# Patient Record
Sex: Male | Born: 1990 | Race: White | Hispanic: No | Marital: Single
Health system: Southern US, Community
[De-identification: ages and names within clinical notes are randomized; demographics above are authoritative.]

---

## 2009-01-07 ENCOUNTER — Emergency Department (HOSPITAL_BASED_OUTPATIENT_CLINIC_OR_DEPARTMENT_OTHER): Admission: EM | Admit: 2009-01-07 | Discharge: 2009-01-07 | Payer: Self-pay | Admitting: Emergency Medicine

## 2009-01-07 ENCOUNTER — Ambulatory Visit: Payer: Self-pay | Admitting: Diagnostic Radiology

## 2009-06-23 ENCOUNTER — Emergency Department (HOSPITAL_COMMUNITY): Admission: EM | Admit: 2009-06-23 | Discharge: 2009-06-23 | Payer: Self-pay | Admitting: Emergency Medicine

## 2010-07-06 LAB — DIFFERENTIAL
Neutro Abs: 8.6 10*3/uL — ABNORMAL HIGH (ref 1.7–7.7)
Neutrophils Relative %: 80 % — ABNORMAL HIGH (ref 43–77)

## 2010-07-06 LAB — CBC
HCT: 45.9 % (ref 39.0–52.0)
MCV: 91.4 fL (ref 78.0–100.0)
Platelets: 190 10*3/uL (ref 150–400)

## 2010-07-06 LAB — POCT I-STAT, CHEM 8
BUN: 14 mg/dL (ref 6–23)
Calcium, Ion: 1.1 mmol/L — ABNORMAL LOW (ref 1.12–1.32)
Chloride: 104 meq/L (ref 96–112)
Creatinine, Ser: 0.8 mg/dL (ref 0.4–1.5)
Glucose, Bld: 125 mg/dL — ABNORMAL HIGH (ref 70–99)
HCT: 46 % (ref 39.0–52.0)
Hemoglobin: 15.6 g/dL (ref 13.0–17.0)
Potassium: 3.3 meq/L — ABNORMAL LOW (ref 3.5–5.1)
Sodium: 138 meq/L (ref 135–145)
TCO2: 26 mmol/L (ref 0–100)

## 2010-07-17 LAB — CBC
HCT: 45.8 % (ref 39.0–52.0)
Hemoglobin: 15.9 g/dL (ref 13.0–17.0)
MCHC: 34.7 g/dL (ref 30.0–36.0)
Platelets: 195 10*3/uL (ref 150–400)
RBC: 5.13 MIL/uL (ref 4.22–5.81)
RDW: 12.4 % (ref 11.5–15.5)
WBC: 10.3 10*3/uL (ref 4.0–10.5)

## 2010-07-17 LAB — POCT TOXICOLOGY PANEL

## 2010-07-17 LAB — DIFFERENTIAL
Basophils Absolute: 0 10*3/uL (ref 0.0–0.1)
Eosinophils Absolute: 0 10*3/uL (ref 0.0–0.7)

## 2010-07-17 LAB — BASIC METABOLIC PANEL
BUN: 10 mg/dL (ref 6–23)
GFR calc non Af Amer: >60 mL/min (ref >60)
Glucose, Bld: 112 mg/dL — ABNORMAL HIGH (ref 70–99)
Potassium: 4.1 mEq/L (ref 3.5–5.1)

## 2010-09-13 ENCOUNTER — Emergency Department (HOSPITAL_BASED_OUTPATIENT_CLINIC_OR_DEPARTMENT_OTHER)
Admission: EM | Admit: 2010-09-13 | Discharge: 2010-09-13 | Disposition: A | Discharge status: Home / Self Care | Payer: Worker's Compensation | Attending: Emergency Medicine | Admitting: Emergency Medicine

## 2010-09-13 ENCOUNTER — Emergency Department (INDEPENDENT_AMBULATORY_CARE_PROVIDER_SITE_OTHER): Payer: Worker's Compensation

## 2010-09-13 DIAGNOSIS — W540XXA Bitten by dog, initial encounter: Secondary | ICD-10-CM | POA: Insufficient documentation

## 2010-09-13 DIAGNOSIS — S61209A Unspecified open wound of unspecified finger without damage to nail, initial encounter: Secondary | ICD-10-CM

## 2010-09-13 DIAGNOSIS — Y92009 Unspecified place in unspecified non-institutional (private) residence as the place of occurrence of the external cause: Secondary | ICD-10-CM | POA: Insufficient documentation

## 2010-09-13 DIAGNOSIS — M79609 Pain in unspecified limb: Secondary | ICD-10-CM

## 2010-09-13 DIAGNOSIS — R609 Edema, unspecified: Secondary | ICD-10-CM

## 2010-11-05 IMAGING — CT CT HEAD W/O CM
1 series · 16 of 30 positions shown, 20 images · non-contrast
Comparison: None available.

CLINICAL DATA: Dizziness and lightheadedness.

CT HEAD WITHOUT CONTRAST
TECHNIQUE: Contiguous axial images were obtained from the base of
the skull through the vertex without contrast.

[Series 2: head 4.8 h37s · axial · 0.47mm/px · z∈[-77,+56]mm · 16 of 32 slices shown, 20 images]
[im 2/32  brain]
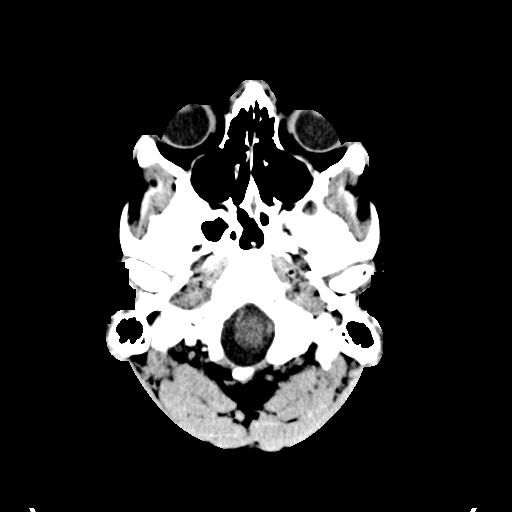
[im 2/32  bone]
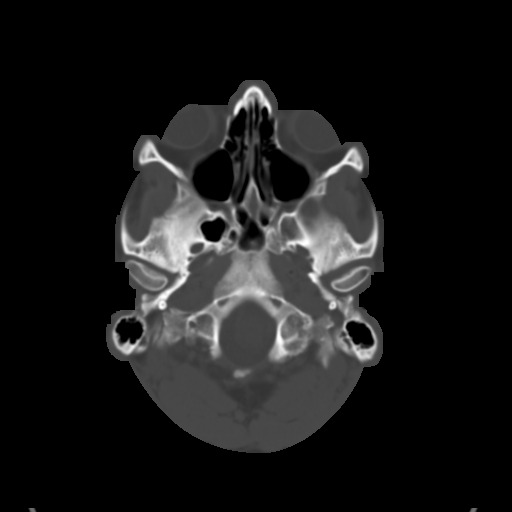
[im 4/32  brain]
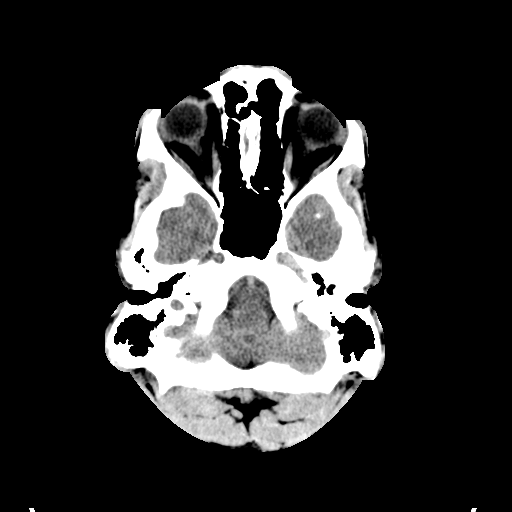
[im 6/32  brain]
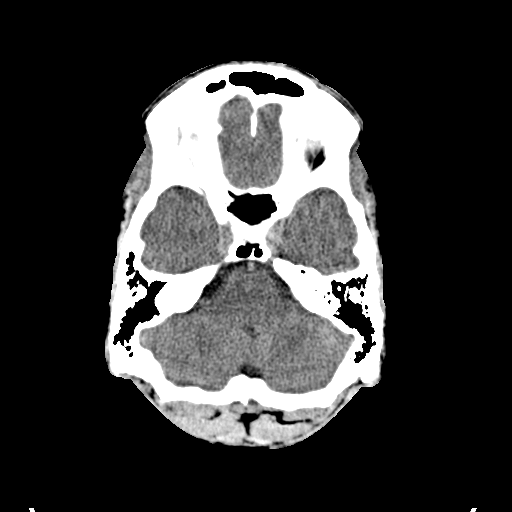
[im 8/32  brain]
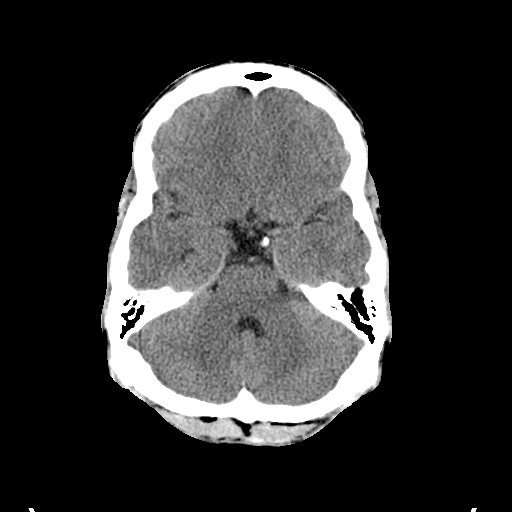
[im 9/32  brain]
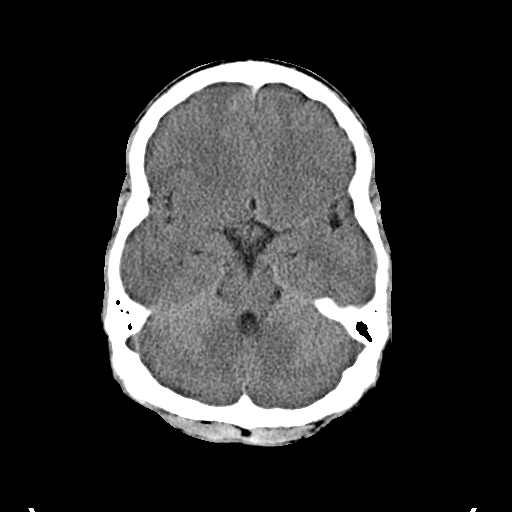
[im 9/32  bone]
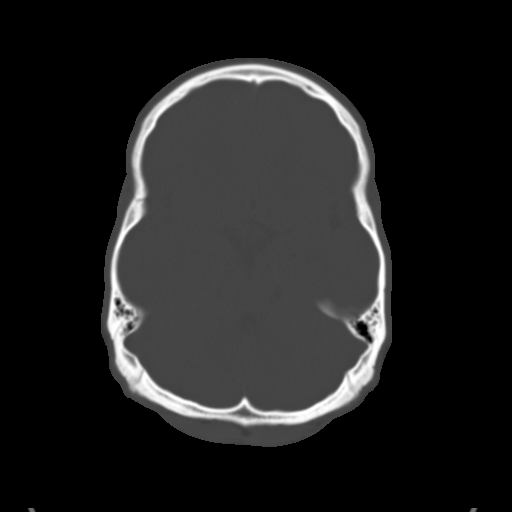
[im 11/32  brain]
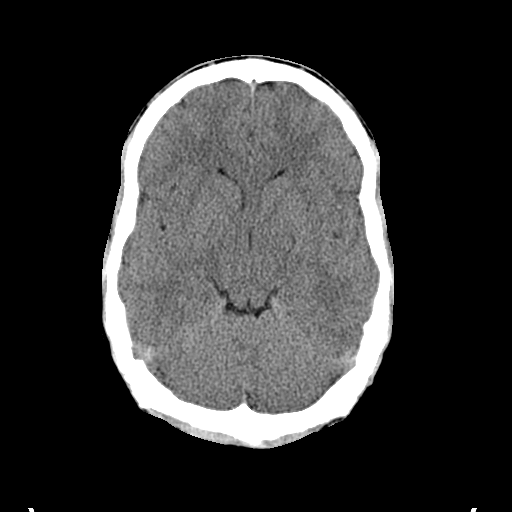
[im 13/32  brain]
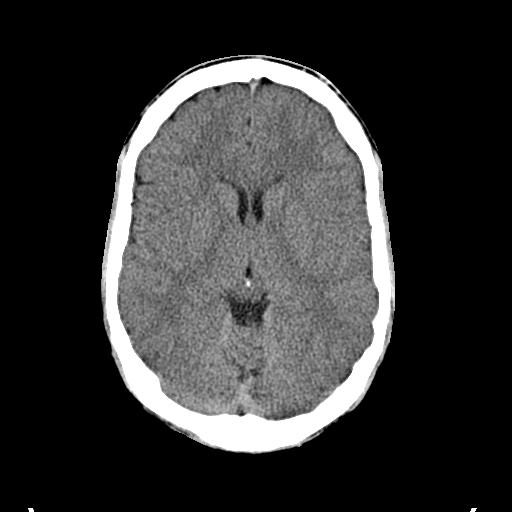
[im 15/32  brain]
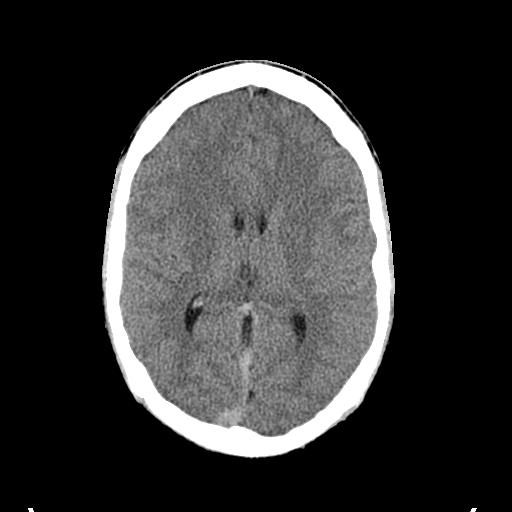
[im 17/32  brain]
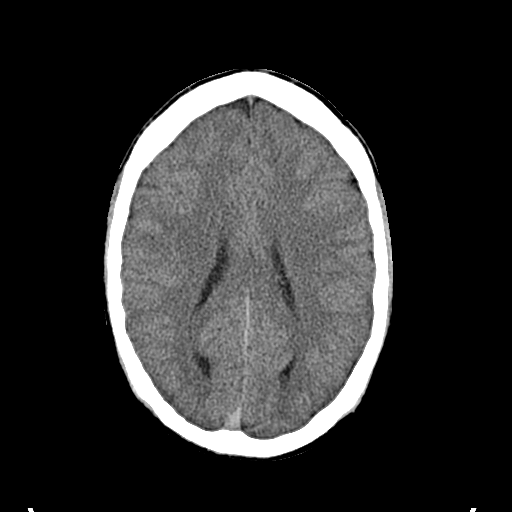
[im 17/32  bone]
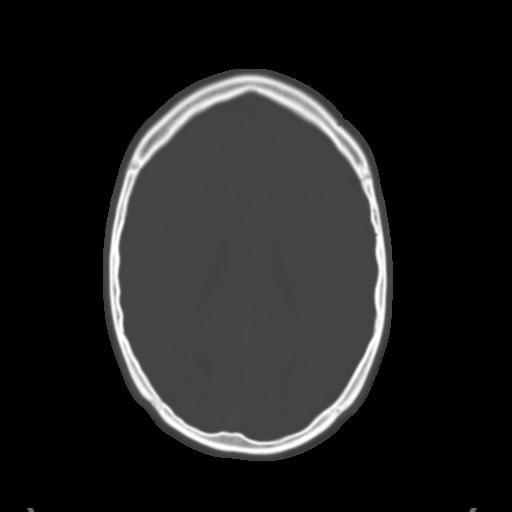
[im 19/32  brain]
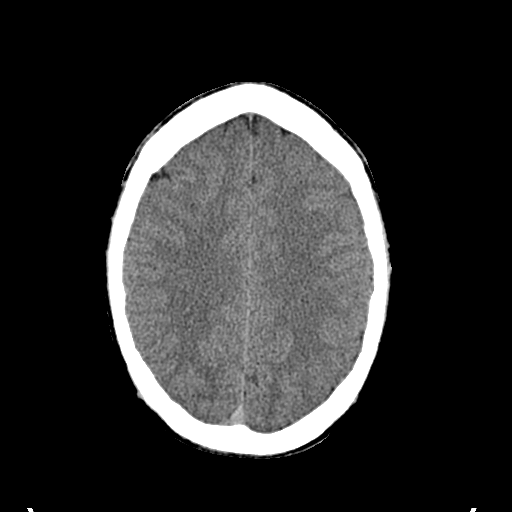
[im 21/32  brain]
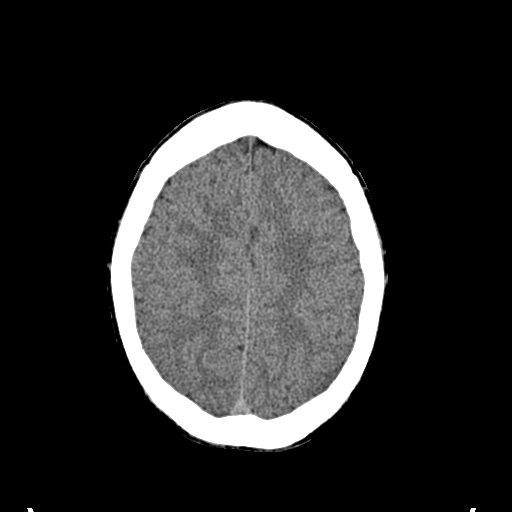
[im 23/32  brain]
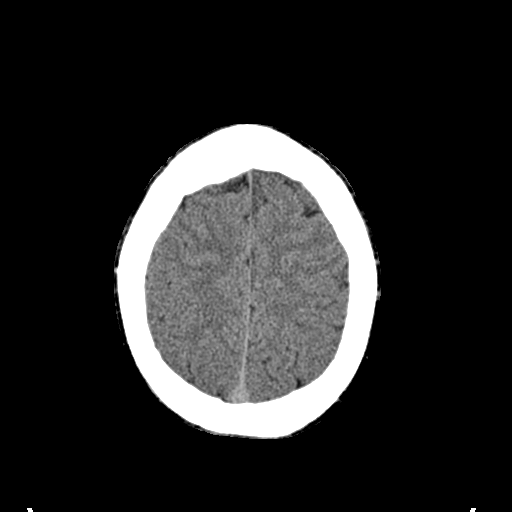
[im 24/32  brain]
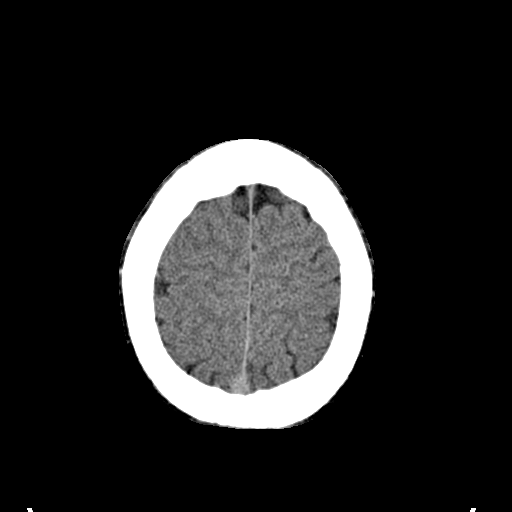
[im 24/32  bone]
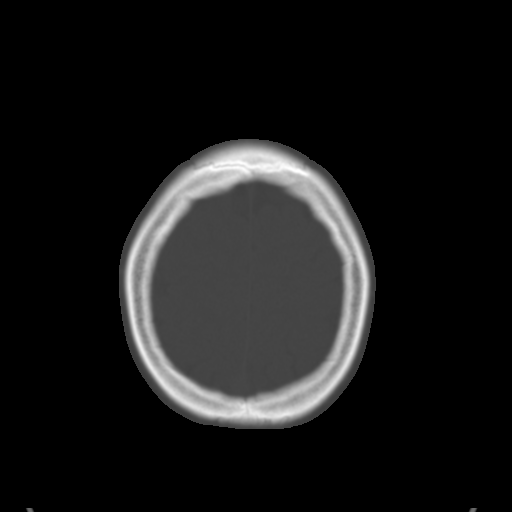
[im 26/32  brain]
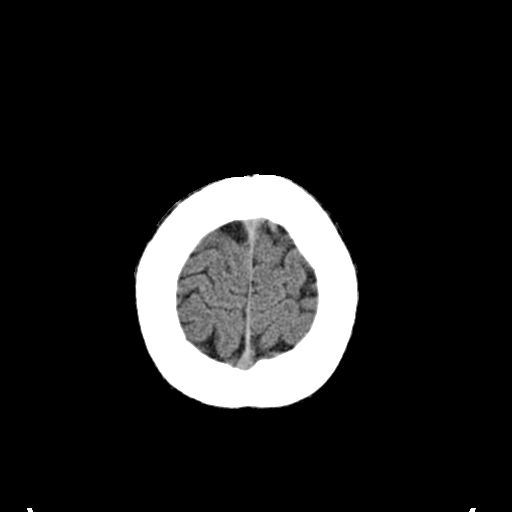
[im 28/32  brain]
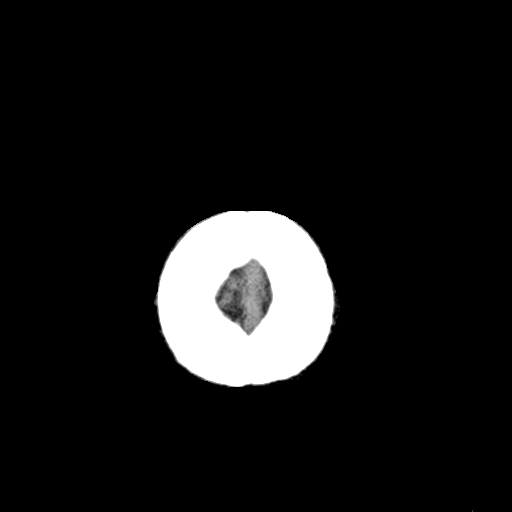
[im 30/32  brain]
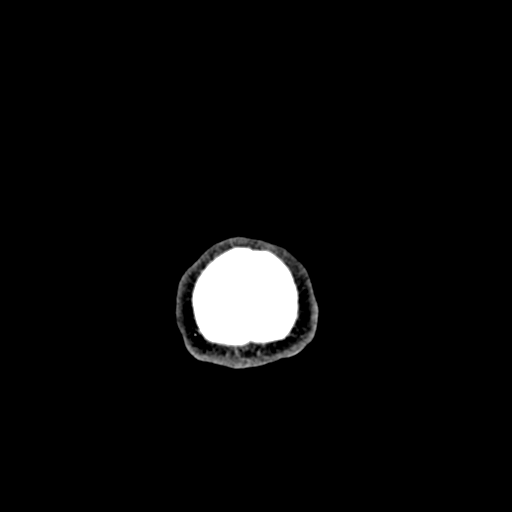

[16 of 30 positions shown; findings below may reference images not displayed]

FINDINGS: The brain appears normal without evidence of acute
infarction, hemorrhage, mass lesion, mass effect, midline shift or
abnormal extra-axial fluid collection.  No hydrocephalus.  Imaged
paranasal sinuses and mastoid air cells are clear.
IMPRESSION: Negative head CT.

## 2012-07-11 IMAGING — CR DG FINGER LITTLE 2+V*L*
3 series · 3 of 3 positions shown · non-contrast
Comparison: None.

CLINICAL DATA: Dog bite.  Penetrating trauma and pain.

LEFT LITTLE FINGER 2+V

[x finger pa left]
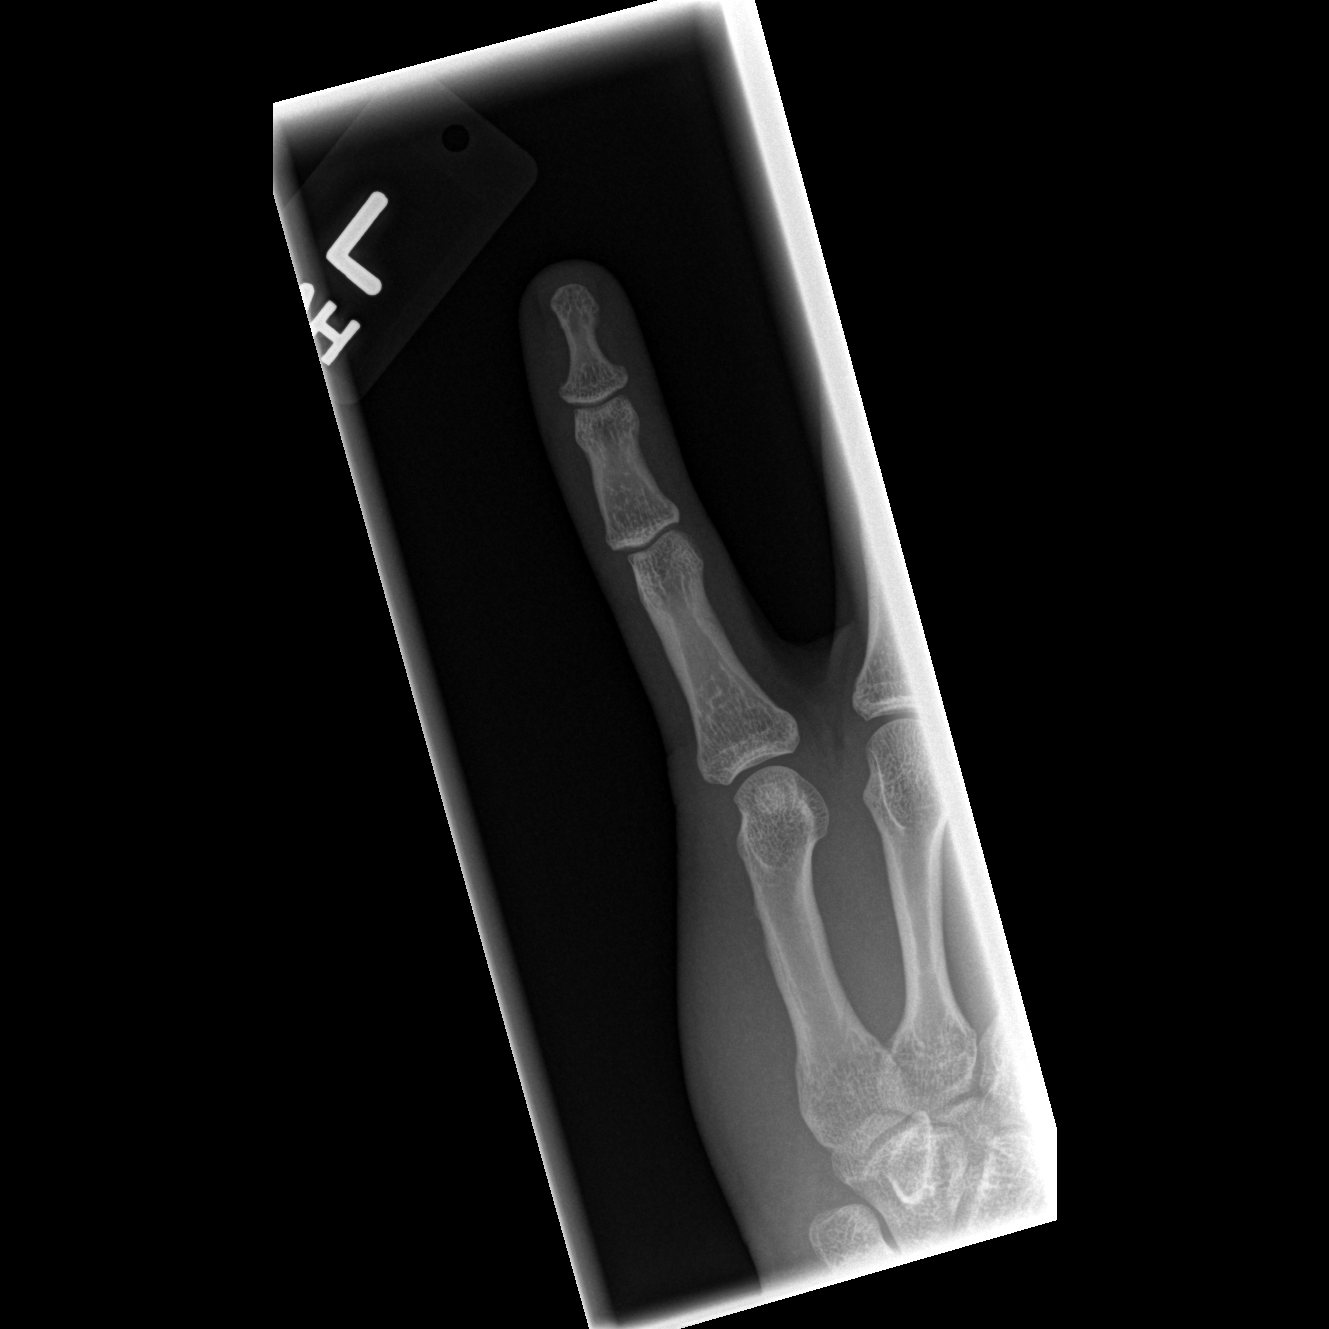

[x finger obl. left]
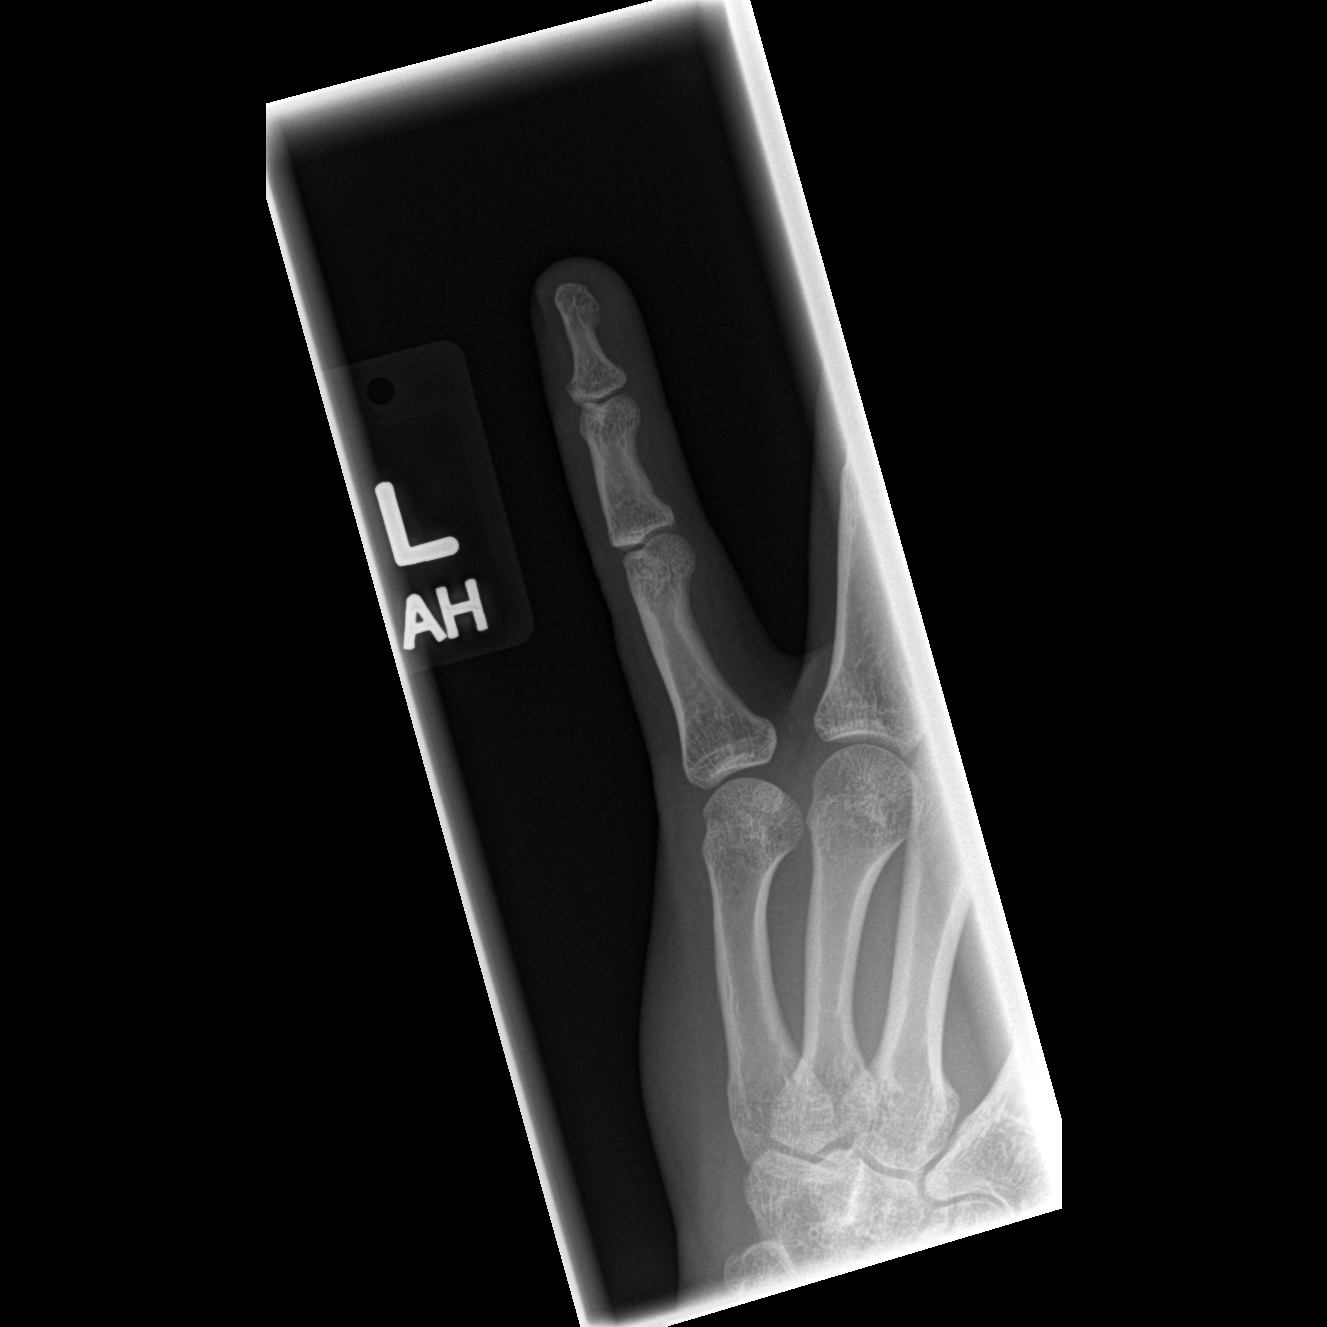

[x finger lateral left]
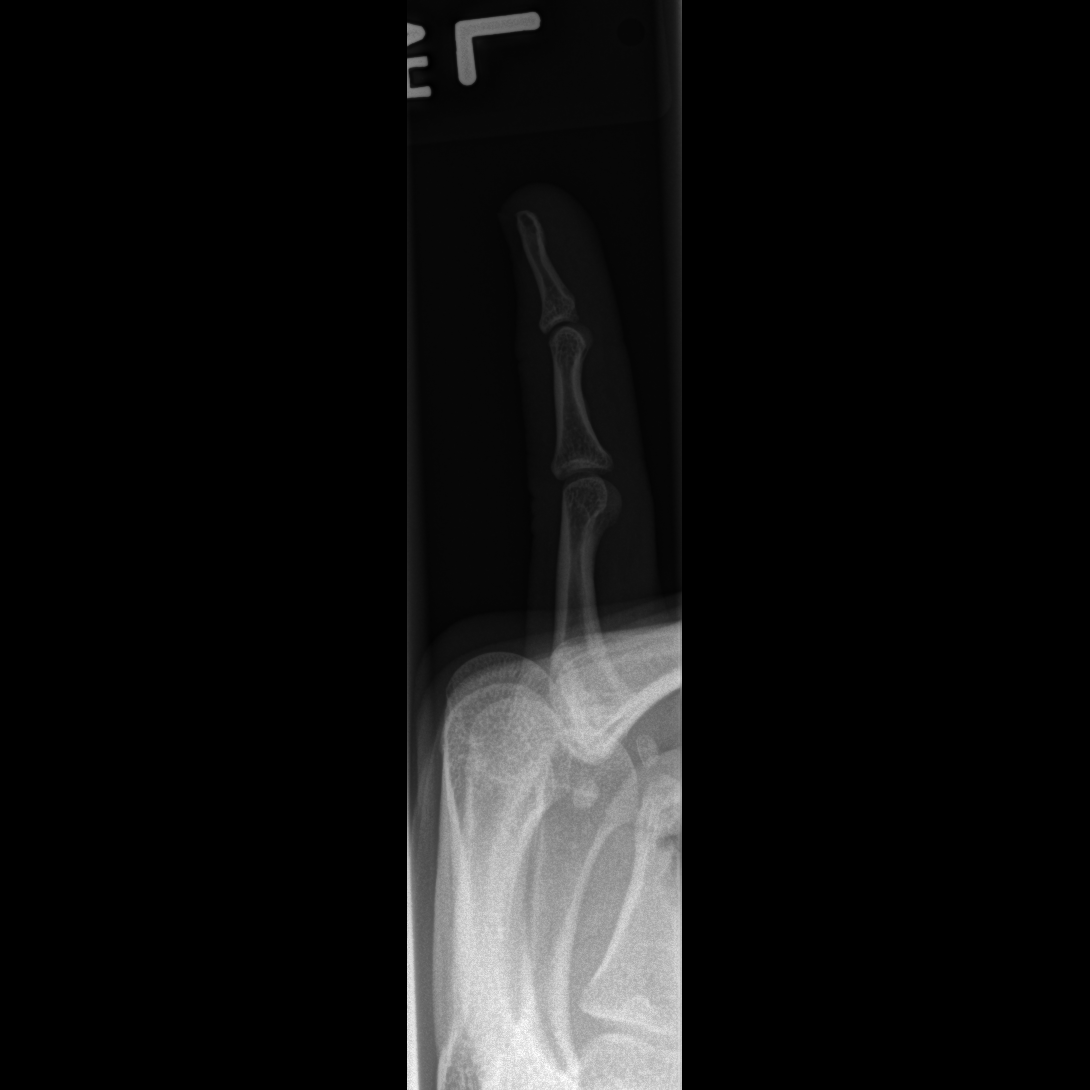

[3 of 3 positions shown; findings below may reference images not displayed]

FINDINGS: Focal soft tissue swelling along the lateral aspect of
the metacarpal region. No evidence of acute fracture or
subluxation.  No focal bone lesions.  Bone matrix and cortex appear
intact.  No abnormal radiopaque densities in the soft tissues.
IMPRESSION: Soft tissue swelling.  No acute bony abnormalities identified.

## 2015-08-12 ENCOUNTER — Ambulatory Visit (INDEPENDENT_AMBULATORY_CARE_PROVIDER_SITE_OTHER): Payer: 59 | Admitting: Sports Medicine

## 2015-08-12 ENCOUNTER — Encounter: Payer: Self-pay | Admitting: Sports Medicine

## 2015-08-12 DIAGNOSIS — L03032 Cellulitis of left toe: Secondary | ICD-10-CM | POA: Diagnosis not present

## 2015-08-12 DIAGNOSIS — M79675 Pain in left toe(s): Secondary | ICD-10-CM | POA: Diagnosis not present

## 2015-08-12 DIAGNOSIS — L03012 Cellulitis of left finger: Secondary | ICD-10-CM

## 2015-08-12 MED ORDER — AMOXICILLIN-POT CLAVULANATE 875-125 MG PO TABS: 1.0000 | ORAL_TABLET | Freq: Two times a day (BID) | ORAL | Status: AC

## 2015-08-12 NOTE — Patient Instructions (Addendum)

## 2015-08-12 NOTE — Progress Notes (Signed)
Patient ID: Bernard Ayala, male   DOB: 03/07/1991, 25 y.o.   MRN: [id number] Subjective: Bernard CooksScott Ayala is a 25 y.o. male patient presents to office today complaining of a painful incurvated, red, swollen lateral nail border of the 1st toe on the left foot. This has been present for>2 weeks. Patient has treated this by trimming. Patient denies fever/chills/nausea/vomitting/any other related constitutional symptoms at this time.  ROS   There are no active problems to display for this patient.   No current outpatient prescriptions on file prior to visit.   No current facility-administered medications on file prior to visit.    No Known Allergies  Objective:  There were no vitals filed for this visit.  General: Well developed, nourished, in no acute distress, alert and oriented x3   Dermatology: Skin is warm, dry and supple bilateral. Left hallux nail appears to be  severely incurvated with hyperkeratosis formation at the distal aspects of  the lateral nail border. (+) Erythema. (+) Edema. (-) serosanguous  drainage present. The remaining nails appear unremarkable at this time. There are no open sores, lesions or other signs of infection present.  Vascular: Dorsalis Pedis artery and Posterior Tibial artery pedal pulses are 2/4 bilateral with immedate capillary fill time. Pedal hair growth present. No lower extremity edema.   Neruologic: Grossly intact via light touch bilateral.  Musculoskeletal: Tenderness to palpation of the Left hallux lateral nail fold. Muscular strength within normal limits in all groups bilateral.   Assesement and Plan: Problem List Items Addressed This Visit    None    Visit Diagnoses    Paronychia, left    -  Primary    lateral hallux     Relevant Medications    amoxicillin-clavulanate (AUGMENTIN) 875-125 MG tablet    Toe pain, left        Relevant Medications    amoxicillin-clavulanate (AUGMENTIN) 875-125 MG tablet       -Discussed treatment alternatives  and plan of care; Explained permanent/temporary nail avulsion and post procedure course to patient. Patient opt for PNA and was informed on possible failure in the setting of acute infection  - After a verbal consent, injected 3 ml of a 50:50 mixture of 2% plain  lidocaine and 0.5% plain marcaine in a normal hallux block fashion. Next, a  betadine prep was performed. Anesthesia was tested and found to be appropriate.  The offending left lateral hallux nail border was then incised from the hyponychium to the epinychium. The offending nail border was removed and cleared from the field. The area was curretted for any remaining nail or spicules. Phenol application performed and the area was then flushed with alcohol and dressed with antibiotic cream and a dry sterile dressing. -Rx Augmentin  -Patient was instructed to leave the dressing intact for today and begin soaking  in a weak solution of betadine and water tomorrow. Patient was instructed to  soak for 15 minutes each day and apply neosporin and a gauze or bandaid dressing each day. -Patient was instructed to monitor the toe for signs of infection and return to office if toe becomes more red, hot or swollen. -Recommend ice, elevation , and motrin if needed for pain -Patient able to continue with normal activities as tolerated  -Patient is to return in 1 week for follow up care or sooner if problems arise.  Asencion Islamitorya Dearl Rudden, DPM

## 2015-08-12 NOTE — Progress Notes (Deleted)
   Subjective:    Patient ID: Bernard Ayala, male    DOB: 03/21/1991, 25 y.o.   MRN: [id number]  HPI  Chief Complaint  Patient presents with  . Ingrown Toenail    left 1st toe        Review of Systems  All other systems reviewed and are negative.      Objective:   Physical Exam        Assessment & Plan:

## 2015-08-25 ENCOUNTER — Ambulatory Visit (INDEPENDENT_AMBULATORY_CARE_PROVIDER_SITE_OTHER): Payer: 59 | Admitting: Sports Medicine

## 2015-08-25 ENCOUNTER — Encounter: Payer: Self-pay | Admitting: Sports Medicine

## 2015-08-25 DIAGNOSIS — M79675 Pain in left toe(s): Secondary | ICD-10-CM

## 2015-08-25 DIAGNOSIS — L03032 Cellulitis of left toe: Secondary | ICD-10-CM

## 2015-08-25 DIAGNOSIS — L03012 Cellulitis of left finger: Secondary | ICD-10-CM

## 2015-08-25 NOTE — Progress Notes (Signed)
Patient ID: Bernard Ayala, male   DOB: 06/17/1990, 25 y.o.   MRN: [id number] Subjective: Bernard Ayala is a 25 y.o. male patient returns to office today for follow up evaluation after having Left Hallux lateral permenant nail avulsion performed on 08-12-15. Patient has been soaking using betadine and applying topical antibiotic covered with bandaid daily. Patient denies fever/chills/nausea/vomitting/any other related constitutional symptoms at this time.  Patient did not pick up Augmentin as ordered.   There are no active problems to display for this patient.   Current Outpatient Prescriptions on File Prior to Visit  Medication Sig Dispense Refill  . amoxicillin-clavulanate (AUGMENTIN) 875-125 MG tablet Take 1 tablet by mouth 2 (two) times daily. 20 tablet 0   No current facility-administered medications on file prior to visit.    No Known Allergies  Objective:  General: Well developed, nourished, in no acute distress, alert and oriented x3   Dermatology: Skin is warm, dry and supple bilateral. Left hallux lateral nail bed appears to be clean, dry, with mild granular tissue and surrounding eschar/scab. (-) Erythema. (+) Edema. (-) serosanguous drainage present. The remaining nails appear unremarkable at this time. There are no other lesions or other signs of infection  present.  Neurovascular status: Intact. No lower extremity swelling; No pain with calf compression bilateral.  Musculoskeletal: Decreased tenderness to palpation of the Left hallux lateral nail fold. Muscular strength within normal limits bilateral.   Assesement and Plan: Problem List Items Addressed This Visit    None    Visit Diagnoses    Paronychia, left    -  Primary    S/p PNA Left lateral hallux     Toe pain, left          -Examined patient  -Cleansed left hallux lateral nail fold and gently scrubbed with peroxide and q-tip/curetted away eschar at site and applied antibiotic cream covered with bandaid.  -Discussed  plan of care with patient. -Patient to now begin soaking in a weak solution of Epsom salt and warm water. Patient was instructed to soak for 15-20 minutes each day until the toe appears normal and there is no drainage, redness, tenderness, or swelling at the procedure site, and apply neosporin and a gauze or bandaid dressing each day as needed. May leave open to air at night. -Educated patient on long term care after nail surgery. -Patient was instructed to monitor the toe for reoccurrence and signs of infection; Patient advised to return to office or go to ER if toe becomes red, hot or swollen. -Patient is to return as needed or sooner if problems arise.  Asencion Islamitorya Maday Guarino, DPM

## 2017-03-17 ENCOUNTER — Ambulatory Visit (INDEPENDENT_AMBULATORY_CARE_PROVIDER_SITE_OTHER): Payer: 59 | Admitting: Podiatry

## 2017-03-17 ENCOUNTER — Encounter: Payer: Self-pay | Admitting: Podiatry

## 2017-03-17 DIAGNOSIS — L600 Ingrowing nail: Secondary | ICD-10-CM | POA: Diagnosis not present

## 2017-03-17 NOTE — Patient Instructions (Signed)

## 2017-03-17 NOTE — Progress Notes (Signed)
Subjective:   Patient ID: Bernard Ayala, male   DOB: 26 y.o.   MRN: [id number]   HPI Patient presents stating the left hallux nail is doing well but the right hallux nail has been very sore and making it hard to wear shoe gear comfortably.  States she is tried to soak it in treatment without relief of symptoms and points to the medial border.   ROS      Objective:  Physical Exam  Neurovascular status intact with patient found to have incurvated right hallux medial border with distal redness but no active drainage noted with pain upon palpation to the nail bed.     Assessment:  Ingrown toenail deformity right hallux medial border with pain     Plan:  H&P condition reviewed and recommended removal of the nail border.  Explained procedure and risk and at this point I infiltrated the right hallux 60 mg Xylocaine Marcaine mixture removed the medial border exposed matrix and applied phenol 3 applications 30 seconds followed by alcohol lavage and sterile dressing.  Given instructions on soaks and reappoint and patient is encouraged to call with any questions or concerns

## 2020-05-13 ENCOUNTER — Emergency Department (HOSPITAL_COMMUNITY): Payer: 59

## 2020-05-13 ENCOUNTER — Encounter (HOSPITAL_COMMUNITY): Payer: Self-pay | Admitting: Emergency Medicine

## 2020-05-13 ENCOUNTER — Emergency Department (HOSPITAL_COMMUNITY)
Admission: EM | Admit: 2020-05-13 | Discharge: 2020-05-13 | Disposition: A | Discharge status: Home / Self Care | Payer: 59 | Attending: Emergency Medicine | Admitting: Emergency Medicine

## 2020-05-13 ENCOUNTER — Other Ambulatory Visit: Payer: Self-pay

## 2020-05-13 DIAGNOSIS — R109 Unspecified abdominal pain: Secondary | ICD-10-CM | POA: Diagnosis present

## 2020-05-13 DIAGNOSIS — N200 Calculus of kidney: Secondary | ICD-10-CM | POA: Diagnosis not present

## 2020-05-13 LAB — URINALYSIS, ROUTINE W REFLEX MICROSCOPIC
Bilirubin Urine: NEGATIVE
Glucose, UA: NEGATIVE mg/dL
Ketones, ur: NEGATIVE mg/dL
Leukocytes,Ua: NEGATIVE
Nitrite: NEGATIVE
Protein, ur: NEGATIVE mg/dL
RBC / HPF: >50 RBC/hpf — ABNORMAL HIGH (ref 0–5)
Specific Gravity, Urine: 1.02 (ref 1.005–1.030)
pH: 6 (ref 5.0–8.0)

## 2020-05-13 LAB — LIPASE, BLOOD: Lipase: 23 U/L (ref 11–51)

## 2020-05-13 LAB — COMPREHENSIVE METABOLIC PANEL
ALT: 38 U/L (ref 0–44)
AST: 33 U/L (ref 15–41)
Albumin: 4.2 g/dL (ref 3.5–5.0)
Alkaline Phosphatase: 49 U/L (ref 38–126)
Anion gap: 14 (ref 5–15)
BUN: 13 mg/dL (ref 6–20)
CO2: 19 mmol/L — ABNORMAL LOW (ref 22–32)
Calcium: 9.5 mg/dL (ref 8.9–10.3)
Chloride: 105 mmol/L (ref 98–111)
Creatinine, Ser: 1.22 mg/dL (ref 0.61–1.24)
GFR, Estimated: >60 mL/min (ref >60)
Glucose, Bld: 174 mg/dL — ABNORMAL HIGH (ref 70–99)
Potassium: 3.1 mmol/L — ABNORMAL LOW (ref 3.5–5.1)
Sodium: 138 mmol/L (ref 135–145)
Total Bilirubin: 0.7 mg/dL (ref 0.3–1.2)
Total Protein: 6.9 g/dL (ref 6.5–8.1)

## 2020-05-13 LAB — CBC
HCT: 44.1 % (ref 39.0–52.0)
Hemoglobin: 16.1 g/dL (ref 13.0–17.0)
MCH: 31.2 pg (ref 26.0–34.0)
MCHC: 36.5 g/dL — ABNORMAL HIGH (ref 30.0–36.0)
MCV: 85.5 fL (ref 80.0–100.0)
Platelets: 262 10*3/uL (ref 150–400)
RBC: 5.16 MIL/uL (ref 4.22–5.81)
RDW: 12.8 % (ref 11.5–15.5)
WBC: 5.7 10*3/uL (ref 4.0–10.5)
nRBC: 0 % (ref 0.0–0.2)

## 2020-05-13 MED ORDER — KETOROLAC TROMETHAMINE 60 MG/2ML IM SOLN
15.0000 mg | Freq: Once | INTRAMUSCULAR | Status: AC
  Administered 2020-05-13: 15 mg via INTRAMUSCULAR
  Filled 2020-05-13: qty 2

## 2020-05-13 MED ORDER — ONDANSETRON 4 MG PO TBDP
4.0000 mg | ORAL_TABLET | Freq: Once | ORAL | Status: AC | PRN
  Administered 2020-05-13: 4 mg via ORAL
  Filled 2020-05-13: qty 1

## 2020-05-13 MED ORDER — ONDANSETRON 4 MG PO TBDP: ORAL_TABLET | ORAL | 0 refills | Status: AC

## 2020-05-13 MED ORDER — TAMSULOSIN HCL 0.4 MG PO CAPS: 0.4000 mg | ORAL_CAPSULE | Freq: Every day | ORAL | 0 refills | Status: AC

## 2020-05-13 MED ORDER — MORPHINE SULFATE 15 MG PO TABS: 7.5000 mg | ORAL_TABLET | ORAL | 0 refills | Status: AC | PRN

## 2020-05-13 MED ORDER — OXYCODONE-ACETAMINOPHEN 5-325 MG PO TABS
1.0000 | ORAL_TABLET | ORAL | Status: DC | PRN
  Administered 2020-05-13: 1 via ORAL
  Filled 2020-05-13: qty 1

## 2020-05-13 NOTE — Discharge Instructions (Signed)

## 2020-05-13 NOTE — ED Triage Notes (Signed)
Pt reports RLQ pain that radiates to R flank, pain began around 0400. Endorses some nausea as well. Denies hx of renal calculi, still has appendix. No urinary symptoms reported.

## 2020-05-13 NOTE — ED Provider Notes (Signed)
[facility] EMERGENCY DEPARTMENT Provider Note   CSN: [id number] Arrival date & time: 05/13/20  2683     History Chief Complaint  Patient presents with  . Abdominal Pain  . Flank Pain    Bernard Ayala is a 30 y.o. male.  30 yo M with a cc of R flank pain.  Woke him up from sleep this morning.  Sharp, severe, nothing seemed to make it better.  Took motrin tylenol.  Arrived to the ED and got pain meds with improvement. No fevers.  Had some nausea.  No hx of stone, no prior abdominal surgery.   The history is provided by the patient.  Abdominal Pain Pain location:  R flank Pain quality: sharp and shooting   Pain radiates to:  R flank Pain severity:  Severe Onset quality:  Sudden Duration:  2 hours Timing:  Constant Progression:  Partially resolved Chronicity:  New Relieved by:  Nothing Worsened by:  Nothing Ineffective treatments:  None tried Associated symptoms: nausea   Associated symptoms: no chest pain, no chills, no diarrhea, no fever, no shortness of breath and no vomiting   Flank Pain Associated symptoms include abdominal pain. Pertinent negatives include no chest pain, no headaches and no shortness of breath.       History reviewed. No pertinent past medical history.  There are no problems to display for this patient.   History reviewed. No pertinent surgical history.     No family history on file.  Social History   Tobacco Use  . Smoking status: Unknown If Ever Smoked    Home Medications Prior to Admission medications   Medication Sig Start Date End Date Taking? Authorizing Provider  morphine (MSIR) 15 MG tablet Take 0.5 tablets (7.5 mg total) by mouth every 4 (four) hours as needed for severe pain. 05/13/20  Yes Melene Plan, DO  ondansetron (ZOFRAN ODT) 4 MG disintegrating tablet 4mg  ODT q4 hours prn nausea/vomit 05/13/20  Yes 07/11/20, DO  tamsulosin (FLOMAX) 0.4 MG CAPS capsule Take 1 capsule (0.4 mg total) by mouth daily after  supper. 05/13/20  Yes 07/11/20, DO  amoxicillin-clavulanate (AUGMENTIN) 875-125 MG tablet Take 1 tablet by mouth 2 (two) times daily. 08/12/15   10/12/15, DPM    Allergies    Patient has no known allergies.  Review of Systems   Review of Systems  Constitutional: Negative for chills and fever.  HENT: Negative for congestion and facial swelling.   Eyes: Negative for discharge and visual disturbance.  Respiratory: Negative for shortness of breath.   Cardiovascular: Negative for chest pain and palpitations.  Gastrointestinal: Positive for abdominal pain and nausea. Negative for diarrhea and vomiting.  Genitourinary: Positive for flank pain.  Musculoskeletal: Negative for arthralgias and myalgias.  Skin: Negative for color change and rash.  Neurological: Negative for tremors, syncope and headaches.  Psychiatric/Behavioral: Negative for confusion and dysphoric mood.    Physical Exam Updated Vital Signs BP 124/69   Pulse 69   Temp 97.6 F (36.4 C) (Oral)   Resp 15   Ht 5\' 11"  (1.803 m)   Wt 108.9 kg   SpO2 99%   BMI 33.47 kg/m   Physical Exam Vitals and nursing note reviewed.  Constitutional:      Appearance: He is well-developed and well-nourished.  HENT:     Head: Normocephalic and atraumatic.  Eyes:     Extraocular Movements: EOM normal.     Pupils: Pupils are equal, round, and reactive to light.  Neck:     Vascular: No JVD.  Cardiovascular:     Rate and Rhythm: Normal rate and regular rhythm.     Heart sounds: No murmur heard. No friction rub. No gallop.   Pulmonary:     Effort: No respiratory distress.     Breath sounds: No wheezing.  Abdominal:     General: There is no distension.     Tenderness: There is no abdominal tenderness. There is no guarding or rebound.     Comments: Benign abdominal exam.  Musculoskeletal:        General: Normal range of motion.     Cervical back: Normal range of motion and neck supple.  Skin:    Coloration: Skin is not pale.      Findings: No rash.  Neurological:     Mental Status: He is alert and oriented to person, place, and time.  Psychiatric:        Mood and Affect: Mood and affect normal.        Behavior: Behavior normal.     ED Results / Procedures / Treatments   Labs (all labs ordered are listed, but only abnormal results are displayed) Labs Reviewed  COMPREHENSIVE METABOLIC PANEL - Abnormal; Notable for the following components:      Result Value   Potassium 3.1 (*)    CO2 19 (*)    Glucose, Bld 174 (*)    All other components within normal limits  CBC - Abnormal; Notable for the following components:   MCHC 36.5 (*)    All other components within normal limits  URINALYSIS, ROUTINE W REFLEX MICROSCOPIC - Abnormal; Notable for the following components:   APPearance HAZY (*)    Hgb urine dipstick LARGE (*)    RBC / HPF >50 (*)    Bacteria, UA RARE (*)    All other components within normal limits  LIPASE, BLOOD    EKG None  Radiology US Renal  Result Date: 05/13/2020 CLINICAL DATA:  Acute right flank pain. EXAM: RENAL / URINARY TRACT ULTRASOUND COMPLETE COMPARISON:  None. FINDINGS: Right Kidney: Renal measurements: 11.4 x 5.8 x 6.6 cm = volume: 233 mL. Echogenicity within normal limits. No mass or hydronephrosis visualized. Left Kidney: Renal measurements: 11.5 x 5.9 x 5.9 cm = volume: 211 mL. Echogenicity within normal limits. No mass or hydronephrosis visualized. Bladder: Appears normal for degree of bladder distention. Other: Diffusely increased hepatic parenchymal echogenicity. IMPRESSION: 1. Unremarkable renal ultrasound. 2. Hepatic steatosis. Electronically Signed   By: Obie Dredge M.D.   On: 05/13/2020 12:20    Procedures Procedures   Medications Ordered in ED Medications  oxyCODONE-acetaminophen (PERCOCET/ROXICET) 5-325 MG per tablet 1 tablet (1 tablet Oral Given 05/13/20 0537)  ondansetron (ZOFRAN-ODT) disintegrating tablet 4 mg (4 mg Oral Given 05/13/20 0537)  ketorolac  (TORADOL) injection 15 mg (15 mg Intramuscular Given 05/13/20 1145)    ED Course  I have reviewed the triage vital signs and the nursing notes.  Pertinent labs & imaging results that were available during my care of the patient were reviewed by me and considered in my medical decision making (see chart for details).    MDM Rules/Calculators/A&P                          30 yo M with severe right flank pain.  Started suddenly nothing seem to make it better or worse and then it resolved spontaneously.  Still feels mildly uncomfortable.  He  is well-appearing nontoxic.  Has benign abdominal exam.  Hematuria on his UA.  Most likely the patient has a kidney stone.  No history of the same.  UA without infection.  Feeling better.  I will have him follow-up with the urologist in the office.  Ultrasound without significant hydro-.  We will have the patient follow-up with his urologist and PCP.  12:27 PM:  I have discussed the diagnosis/risks/treatment options with the patient and believe the pt to be eligible for discharge home to follow-up with Urology. We also discussed returning to the ED immediately if new or worsening sx occur. We discussed the sx which are most concerning (e.g., sudden worsening pain, fever, inability to tolerate by mouth) that necessitate immediate return. Medications administered to the patient during their visit and any new prescriptions provided to the patient are listed below.  Medications given during this visit Medications  oxyCODONE-acetaminophen (PERCOCET/ROXICET) 5-325 MG per tablet 1 tablet (1 tablet Oral Given 05/13/20 0537)  ondansetron (ZOFRAN-ODT) disintegrating tablet 4 mg (4 mg Oral Given 05/13/20 0537)  ketorolac (TORADOL) injection 15 mg (15 mg Intramuscular Given 05/13/20 1145)     The patient appears reasonably screen and/or stabilized for discharge and I doubt any other medical condition or other [facility] requiring further screening, evaluation, or treatment in the ED  at this time prior to discharge.   Final Clinical Impression(s) / ED Diagnoses Final diagnoses:  Nephrolithiasis    Rx / DC Orders ED Discharge Orders         Ordered    morphine (MSIR) 15 MG tablet  Every 4 hours PRN        05/13/20 1227    ondansetron (ZOFRAN ODT) 4 MG disintegrating tablet        05/13/20 1227    tamsulosin (FLOMAX) 0.4 MG CAPS capsule  Daily after supper        05/13/20 1227           Melene Plan, DO 05/13/20 1227
